# Patient Record
Sex: Male | Born: 1955 | Race: White | Hispanic: No | Marital: Married | Smoking: Current every day smoker
Health system: Southern US, Community
[De-identification: ages and names within clinical notes are randomized; demographics above are authoritative.]

## PROBLEM LIST (undated history)

## (undated) DIAGNOSIS — I10 Essential (primary) hypertension: Secondary | ICD-10-CM

## (undated) DIAGNOSIS — E119 Type 2 diabetes mellitus without complications: Secondary | ICD-10-CM

## (undated) DIAGNOSIS — M1990 Unspecified osteoarthritis, unspecified site: Secondary | ICD-10-CM

## (undated) HISTORY — DX: Type 2 diabetes mellitus without complications: E11.9

## (undated) HISTORY — PX: EYE SURGERY: SHX253

## (undated) HISTORY — DX: Essential (primary) hypertension: I10

## (undated) HISTORY — PX: NASAL SINUS SURGERY: SHX719

## (undated) HISTORY — PX: CARDIAC CATHETERIZATION: SHX172

## (undated) HISTORY — PX: OTHER SURGICAL HISTORY: SHX169

## (undated) HISTORY — PX: RECTAL SURGERY: SHX760

## (undated) HISTORY — DX: Unspecified osteoarthritis, unspecified site: M19.90

## (undated) HISTORY — PX: BACK SURGERY: SHX140

## (undated) HISTORY — PX: CERVICAL DISC SURGERY: SHX588

---

## 2019-10-13 ENCOUNTER — Other Ambulatory Visit: Payer: Self-pay | Admitting: Podiatry

## 2019-10-15 NOTE — Patient Instructions (Signed)
Corey Jensen  10/15/2019     _0 @   Your procedure is scheduled on  7/28/20221.  [facility] to [facility]  Call this number if you have problems the morning of surgery:  [phone]   Remember:  Do not eat or drink after midnight.                         Take these medicines the morning of surgery with A SIP OF WATER  Cymbalta, gabapentin, metoprolol, nucynta.    Do not wear jewelry, make-up or nail polish.  Do not wear lotions, powders, or perfumes, or deodorant.  Do not shave 48 hours prior to surgery.  Men may shave face and neck.  Do not bring valuables to the hospital.  [facility] is not responsible for any belongings or valuables.  Contacts, dentures or bridgework may not be worn into surgery.  Leave your suitcase in the car.  After surgery it may be brought to your room.  For patients admitted to the hospital, discharge time will be determined by your treatment team.  Patients discharged the day of surgery will not be allowed to drive home.   Name and phone number of your driver:   family Special instructions:  DO NOT smoke the morning of your procedure.  Please read over the following fact sheets that you were given. Anesthesia Post-op Instructions and Care and Recovery After Surgery       Living With an Amputation An amputation is a surgical procedure to remove all or part of an arm or leg (limb). After this surgery, it will take time for you to heal and get used to living with the amputation. There are things you can do to help yourself adjust. Living with an amputation can be challenging, but it is often possible to do all of the activities that you used to do. How to manage lifestyle changes      Return to your normal activities as told by your health care provider. Ask your health care provider what activities are safe for you.  You may choose to have an artificial limb (prosthesis) made for you. Use and care  for your prosthesis as told by your health care provider.  Discuss all of your leisure interests with your health care provider and prosthesis specialist. Equipment changes can often be made, which may allow you to return to a sport or hobby. Some Teacher, English as a foreign language for this purpose.  Talk with your health care team about returning to work. ? When you are ready to return to work, your therapists can evaluate your job site and make recommendations to help you do your job. ? If you are not able to return to the same job, [employer] can help you with job retraining.  Talk with your health care provider about returning to driving. You may: ? Work with an occupational therapist to learn new ways for safely driving with an amputation. ? Work with a prosthesis specialist or physical therapist to identify assistive devices for safe driving. How to recognize stress Some common challenges of living with an amputation may bring about stress. These issues are normal. They include:  Changes in how you move around.  Changes in how you care for yourself.  Changes in how you participate in leisure activities.  Emotions after the amputation, such as grief and anger.  Issues with body image and weight.  Problems with pain and treatment. How to manage stress Use these strategies to ease stress related to the daily challenges of living with an amputation:  Be aware of any sources of your stress. Monitor yourself for symptoms of stress. Identify what challenges cause the most stress for you.  Rethink the situation. Try to: ? Think realistically about stressful challenges. Do not ignore these challenges. Do not overreact to them. ? Try to find the positives in a stressful situation. Do not focus on the negatives.  Talk about your emotional challenges, such as grief, with a mental health professional.  Connect with other people who have gone through  the same experience.  Find ways to help relax your body and mind. Examples include: ? Meditation, deep breathing, or progressive relaxation techniques. ? Hypnosis or guided imagery. ? Yoga or tai chi. ? Biofeedback or mindfulness techniques. ? Keeping a journal. ? Doing hobbies, like listening to music or being out in nature. ? Exercise. Talk with your rehabilitation team about the best way to get 30 minutes of physical activity at least 5 days a week. Where to find support: Talking to others  Look for a local or online support group for people living with an amputation. Finances  Talk to your insurance company about what assistive devices your plan covers.  Contact national or local amputee charities to find out if you are eligible for scholarships and medical equipment for people in need.  People in [employer] service may be eligible for financial assistance or programs that support amputees. Rehabilitation A rehabilitation program can help you regain mobility and independence. Your rehabilitation team may include:  Physicians and nurses.  Physical and occupational therapists.  Prosthesis specialists.  Social workers.  Mental health professionals.  Diet and nutrition specialists. Follow these instructions at home: Managing pain  Work with your health care provider to manage any residual or phantom limb pain. This may include: ? Pain medicine. ? Physical therapy. ? Techniques that help retrain the brain and nervous system (movement representation techniques). ? Control and instrumentation engineer. For this treatment, stimulation is applied to different parts of your stump, and you describe what you feel. ? Biofeedback. This involves using monitors that alert you to changes in your breathing, heart rate, skin temperature, or muscle activity, and using relaxation techniques to reverse those changes. ? Acupuncture. Eating and drinking  Work with a dietitian to develop an eating  plan that helps you maintain a healthy body weight. Your plan may include a balance of: ? Fresh fruits and vegetables. ? Whole grains. ? Lean meats. ? Low-fat dairy. ? Healthy fats, such as fish, nuts, avocado, or olive oil.  Do not drink alcohol to cope with stress. General instructions  Take over-the-counter and prescription medicines only as told by your health care provider.  Do not use any products that contain nicotine or tobacco, such as cigarettes and e-cigarettes. If you need help quitting, ask your health care provider.  Keep all follow-up visits as told by your health care provider. This is important. Where to find more information You can find more information about living with an amputation from:  Amputee Coalition: www.amputee-coalition.org  Amputee Support Groups: amputee.supportgroups.[employer]: www.nationalamputation.[facility] on Health, Physical Activity and Disability: www.nchpad.org  [employer] [place]: www.disabledsports[place].org Contact a health care provider if:  Your pain does not improve with medicine or treatment.  You have trouble managing your emotions about losing an extremity.  Get help right away if you:  Have severe pain. Summary  It will take time for you to heal and get used to living with an amputation.  Look for a local or online support group for people living with an amputation.  Work with your health care provider to manage any phantom limb pain. This information is not intended to replace advice given to you by your health care provider. Make sure you discuss any questions you have with your health care provider. Document Revised: 07/03/2018 Document Reviewed: 02/05/2017 Elsevier Patient Education  2020 [place] After These instructions provide you with information about caring for yourself after your procedure. Your health care provider may also give you more  specific instructions. Your treatment has been planned according to current medical practices, but problems sometimes occur. Call your health care provider if you have any problems or questions after your procedure. What can I expect after the procedure? After your procedure, you may:  Feel sleepy for several hours.  Feel clumsy and have poor balance for several hours.  Feel forgetful about what happened after the procedure.  Have poor judgment for several hours.  Feel nauseous or vomit.  Have a sore throat if you had a breathing tube during the procedure. Follow these instructions at home: For at least 24 hours after the procedure:      Have a responsible adult stay with you. It is important to have someone help care for you until you are awake and alert.  Rest as needed.  Do not: ? Participate in activities in which you could fall or become injured. ? Drive. ? Use heavy machinery. ? Drink alcohol. ? Take sleeping pills or medicines that cause drowsiness. ? Make important decisions or sign legal documents. ? Take care of children on your own. Eating and drinking  Follow the diet that is recommended by your health care provider.  If you vomit, drink water, juice, or soup when you can drink without vomiting.  Make sure you have little or no nausea before eating solid foods. General instructions  Take over-the-counter and prescription medicines only as told by your health care provider.  If you have sleep apnea, surgery and certain medicines can increase your risk for breathing problems. Follow instructions from your health care provider about wearing your sleep device: ? Anytime you are sleeping, including during daytime naps. ? While taking prescription pain medicines, sleeping medicines, or medicines that make you drowsy.  If you smoke, do not smoke without supervision.  Keep all follow-up visits as told by your health care provider. This is important. Contact a  health care provider if:  You keep feeling nauseous or you keep vomiting.  You feel light-headed.  You develop a rash.  You have a fever. Get help right away if:  You have trouble breathing. Summary  For several hours after your procedure, you may feel sleepy and have poor judgment.  Have a responsible adult stay with you for at least 24 hours or until you are awake and alert. This information is not intended to replace advice given to you by your health care provider. Make sure you discuss any questions you have with your health care provider. Document Revised: 06/09/2017 Document Reviewed: 07/02/2015 Elsevier Patient Education  [place]. How to Use Chlorhexidine for Bathing Chlorhexidine gluconate (CHG) is a germ-killing (antiseptic) solution that is used to clean the skin. It can get rid of the bacteria that normally live on  the skin and can keep them away for about 24 hours. To clean your skin with CHG, you may be given:  A CHG solution to use in the shower or as part of a sponge bath.  A prepackaged cloth that contains CHG. Cleaning your skin with CHG may help lower the risk for infection:  While you are staying in the intensive care unit of the hospital.  If you have a vascular access, such as a central line, to provide short-term or long-term access to your veins.  If you have a catheter to drain urine from your bladder.  If you are on a ventilator. A ventilator is a machine that helps you breathe by moving air in and out of your lungs.  After surgery. What are the risks? Risks of using CHG include:  A skin reaction.  Hearing loss, if CHG gets in your ears.  Eye injury, if CHG gets in your eyes and is not rinsed out.  The CHG product catching fire. Make sure that you avoid smoking and flames after applying CHG to your skin. Do not use CHG:  If you have a chlorhexidine allergy or have previously reacted to chlorhexidine.  On babies younger than 49  months of age. How to use CHG solution  Use CHG only as told by your health care provider, and follow the instructions on the label.  Use the full amount of CHG as directed. Usually, this is one bottle. During a shower Follow these steps when using CHG solution during a shower (unless your health care provider gives you different instructions): 1. Start the shower. 2. Use your normal soap and shampoo to wash your face and hair. 3. Turn off the shower or move out of the shower stream. 4. Pour the CHG onto a clean washcloth. Do not use any type of brush or rough-edged sponge. 5. Starting at your neck, lather your body down to your toes. Make sure you follow these instructions: ? If you will be having surgery, pay special attention to the part of your body where you will be having surgery. Scrub this area for at least 1 minute. ? Do not use CHG on your head or face. If the solution gets into your ears or eyes, rinse them well with water. ? Avoid your genital area. ? Avoid any areas of skin that have broken skin, cuts, or scrapes. ? Scrub your back and under your arms. Make sure to wash skin folds. 6. Let the lather sit on your skin for 1-2 minutes or as long as told by your health care provider. 7. Thoroughly rinse your entire body in the shower. Make sure that all body creases and crevices are rinsed well. 8. Dry off with a clean towel. Do not put any substances on your body afterward--such as powder, lotion, or perfume--unless you are told to do so by your health care provider. Only use lotions that are recommended by the manufacturer. 9. Put on clean clothes or pajamas. 10. If it is the night before your surgery, sleep in clean sheets.  During a sponge bath Follow these steps when using CHG solution during a sponge bath (unless your health care provider gives you different instructions): 1. Use your normal soap and shampoo to wash your face and hair. 2. Pour the CHG onto a clean  washcloth. 3. Starting at your neck, lather your body down to your toes. Make sure you follow these instructions: ? If you will be having surgery, pay special attention  to the part of your body where you will be having surgery. Scrub this area for at least 1 minute. ? Do not use CHG on your head or face. If the solution gets into your ears or eyes, rinse them well with water. ? Avoid your genital area. ? Avoid any areas of skin that have broken skin, cuts, or scrapes. ? Scrub your back and under your arms. Make sure to wash skin folds. 4. Let the lather sit on your skin for 1-2 minutes or as long as told by your health care provider. 5. Using a different clean, wet washcloth, thoroughly rinse your entire body. Make sure that all body creases and crevices are rinsed well. 6. Dry off with a clean towel. Do not put any substances on your body afterward--such as powder, lotion, or perfume--unless you are told to do so by your health care provider. Only use lotions that are recommended by the manufacturer. 7. Put on clean clothes or pajamas. 8. If it is the night before your surgery, sleep in clean sheets. How to use CHG prepackaged cloths  Only use CHG cloths as told by your health care provider, and follow the instructions on the label.  Use the CHG cloth on clean, dry skin.  Do not use the CHG cloth on your head or face unless your health care provider tells you to.  When washing with the CHG cloth: ? Avoid your genital area. ? Avoid any areas of skin that have broken skin, cuts, or scrapes. Before surgery Follow these steps when using a CHG cloth to clean before surgery (unless your health care provider gives you different instructions): 1. Using the CHG cloth, vigorously scrub the part of your body where you will be having surgery. Scrub using a back-and-forth motion for 3 minutes. The area on your body should be completely wet with CHG when you are done scrubbing. 2. Do not rinse. Discard  the cloth and let the area air-dry. Do not put any substances on the area afterward, such as powder, lotion, or perfume. 3. Put on clean clothes or pajamas. 4. If it is the night before your surgery, sleep in clean sheets.  For general bathing Follow these steps when using CHG cloths for general bathing (unless your health care provider gives you different instructions). 1. Use a separate CHG cloth for each area of your body. Make sure you wash between any folds of skin and between your fingers and toes. Wash your body in the following order, switching to a new cloth after each step: ? The front of your neck, shoulders, and chest. ? Both of your arms, under your arms, and your hands. ? Your stomach and groin area, avoiding the genitals. ? Your right leg and foot. ? Your left leg and foot. ? The back of your neck, your back, and your buttocks. 2. Do not rinse. Discard the cloth and let the area air-dry. Do not put any substances on your body afterward--such as powder, lotion, or perfume--unless you are told to do so by your health care provider. Only use lotions that are recommended by the manufacturer. 3. Put on clean clothes or pajamas. Contact a health care provider if:  Your skin gets irritated after scrubbing.  You have questions about using your solution or cloth. Get help right away if:  Your eyes become very red or swollen.  Your eyes itch badly.  Your skin itches badly and is red or swollen.  Your hearing changes.  You  have trouble seeing.  You have swelling or tingling in your mouth or throat.  You have trouble breathing.  You swallow any chlorhexidine. Summary  Chlorhexidine gluconate (CHG) is a germ-killing (antiseptic) solution that is used to clean the skin. Cleaning your skin with CHG may help to lower your risk for infection.  You may be given CHG to use for bathing. It may be in a bottle or in a prepackaged cloth to use on your skin. Carefully follow your  health care provider's instructions and the instructions on the product label.  Do not use CHG if you have a chlorhexidine allergy.  Contact your health care provider if your skin gets irritated after scrubbing. This information is not intended to replace advice given to you by your health care provider. Make sure you discuss any questions you have with your health care provider. Document Revised: 05/28/2018 Document Reviewed: 02/06/2017 Elsevier Patient Education  [place].

## 2019-10-19 ENCOUNTER — Encounter (HOSPITAL_COMMUNITY)
Admission: RE | Admit: 2019-10-19 | Discharge: 2019-10-19 | Disposition: A | Discharge status: Home / Self Care | Payer: Medicare Other | Source: Ambulatory Visit | Attending: Podiatry | Admitting: Podiatry

## 2019-10-19 ENCOUNTER — Encounter (HOSPITAL_COMMUNITY): Payer: Self-pay

## 2019-10-19 ENCOUNTER — Other Ambulatory Visit (HOSPITAL_COMMUNITY)
Admission: RE | Admit: 2019-10-19 | Discharge: 2019-10-19 | Disposition: A | Discharge status: Home / Self Care | Payer: Medicare Other | Source: Ambulatory Visit | Attending: Podiatry | Admitting: Podiatry

## 2019-10-19 ENCOUNTER — Other Ambulatory Visit (HOSPITAL_COMMUNITY): Payer: Self-pay

## 2019-10-19 ENCOUNTER — Other Ambulatory Visit: Payer: Self-pay

## 2019-10-19 DIAGNOSIS — Z01812 Encounter for preprocedural laboratory examination: Secondary | ICD-10-CM | POA: Diagnosis present

## 2019-10-19 DIAGNOSIS — Z20822 Contact with and (suspected) exposure to covid-19: Secondary | ICD-10-CM | POA: Insufficient documentation

## 2019-10-19 DIAGNOSIS — Z01810 Encounter for preprocedural cardiovascular examination: Secondary | ICD-10-CM | POA: Diagnosis not present

## 2019-10-19 LAB — BASIC METABOLIC PANEL
Anion gap: 12 (ref 5–15)
BUN: 30 mg/dL — ABNORMAL HIGH (ref 8–23)
CO2: 22 mmol/L (ref 22–32)
Calcium: 9.8 mg/dL (ref 8.9–10.3)
Chloride: 94 mmol/L — ABNORMAL LOW (ref 98–111)
Creatinine, Ser: 1.67 mg/dL — ABNORMAL HIGH (ref 0.61–1.24)
GFR calc Af Amer: 49 mL/min — ABNORMAL LOW (ref >60)
GFR calc non Af Amer: 42 mL/min — ABNORMAL LOW (ref >60)
Glucose, Bld: 124 mg/dL — ABNORMAL HIGH (ref 70–99)
Potassium: 4.5 mmol/L (ref 3.5–5.1)
Sodium: 128 mmol/L — ABNORMAL LOW (ref 135–145)

## 2019-10-19 LAB — GLUCOSE, CAPILLARY: Glucose-Capillary: 135 mg/dL — ABNORMAL HIGH (ref 70–99)

## 2019-10-19 LAB — HEMOGLOBIN A1C
Hgb A1c MFr Bld: 6.1 % — ABNORMAL HIGH (ref 4.8–5.6)
Mean Plasma Glucose: 128.37 mg/dL

## 2019-10-19 LAB — SARS CORONAVIRUS 2 (TAT 6-24 HRS): SARS Coronavirus 2: NEGATIVE

## 2019-10-19 NOTE — Progress Notes (Signed)
   10/19/19 0950  OBSTRUCTIVE SLEEP APNEA  Have you ever been diagnosed with sleep apnea through a sleep study? No  Do you snore loudly (loud enough to be heard through closed doors)?  1  Do you often feel tired, fatigued, or sleepy during the daytime (such as falling asleep during driving or talking to someone)? 1  Has anyone observed you stop breathing during your sleep? 0  Do you have, or are you being treated for high blood pressure? 1  BMI more than 35 kg/m2? 0  Age > 50 (1-yes) 1  Male Gender (Yes=1) 1  Obstructive Sleep Apnea Score 5

## 2019-10-19 NOTE — Progress Notes (Signed)
   10/19/19 0950  OBSTRUCTIVE SLEEP APNEA  Have you ever been diagnosed with sleep apnea through a sleep study? No  Do you snore loudly (loud enough to be heard through closed doors)?  1  Do you often feel tired, fatigued, or sleepy during the daytime (such as falling asleep during driving or talking to someone)? 1  Has anyone observed you stop breathing during your sleep? 0  Do you have, or are you being treated for high blood pressure? 1  BMI more than 35 kg/m2? 0  Age > 50 (1-yes) 1  Neck circumference greater than:Male 16 inches or larger, Male 17inches or larger? 0  Male Gender (Yes=1) 1  Obstructive Sleep Apnea Score 5

## 2019-10-20 ENCOUNTER — Encounter (HOSPITAL_COMMUNITY)
Admission: RE | Disposition: A | Discharge status: Home / Self Care | Payer: Self-pay | Source: Home / Self Care | Attending: Podiatry

## 2019-10-20 ENCOUNTER — Ambulatory Visit (HOSPITAL_COMMUNITY): Payer: Medicare Other | Admitting: Anesthesiology

## 2019-10-20 ENCOUNTER — Encounter (HOSPITAL_COMMUNITY): Payer: Self-pay | Admitting: *Deleted

## 2019-10-20 ENCOUNTER — Ambulatory Visit (HOSPITAL_COMMUNITY)
Admission: RE | Admit: 2019-10-20 | Discharge: 2019-10-20 | Disposition: A | Discharge status: Home / Self Care | Payer: Medicare Other | Attending: Podiatry | Admitting: Podiatry

## 2019-10-20 ENCOUNTER — Ambulatory Visit (HOSPITAL_COMMUNITY): Payer: Medicare Other

## 2019-10-20 DIAGNOSIS — M86172 Other acute osteomyelitis, left ankle and foot: Secondary | ICD-10-CM | POA: Diagnosis present

## 2019-10-20 DIAGNOSIS — M1990 Unspecified osteoarthritis, unspecified site: Secondary | ICD-10-CM | POA: Diagnosis not present

## 2019-10-20 DIAGNOSIS — Z7984 Long term (current) use of oral hypoglycemic drugs: Secondary | ICD-10-CM | POA: Insufficient documentation

## 2019-10-20 DIAGNOSIS — E119 Type 2 diabetes mellitus without complications: Secondary | ICD-10-CM | POA: Diagnosis not present

## 2019-10-20 DIAGNOSIS — Z79899 Other long term (current) drug therapy: Secondary | ICD-10-CM | POA: Diagnosis not present

## 2019-10-20 DIAGNOSIS — F17200 Nicotine dependence, unspecified, uncomplicated: Secondary | ICD-10-CM | POA: Diagnosis not present

## 2019-10-20 DIAGNOSIS — I10 Essential (primary) hypertension: Secondary | ICD-10-CM | POA: Insufficient documentation

## 2019-10-20 DIAGNOSIS — Z9889 Other specified postprocedural states: Secondary | ICD-10-CM

## 2019-10-20 HISTORY — PX: AMPUTATION: SHX166

## 2019-10-20 LAB — GLUCOSE, CAPILLARY: Glucose-Capillary: 147 mg/dL — ABNORMAL HIGH (ref 70–99)

## 2019-10-20 LAB — POCT I-STAT, CHEM 8
BUN: 27 mg/dL — ABNORMAL HIGH (ref 8–23)
Calcium, Ion: 1.1 mmol/L — ABNORMAL LOW (ref 1.15–1.40)
Chloride: 102 mmol/L (ref 98–111)
Creatinine, Ser: 1.4 mg/dL — ABNORMAL HIGH (ref 0.61–1.24)
Glucose, Bld: 181 mg/dL — ABNORMAL HIGH (ref 70–99)
HCT: 41 % (ref 39.0–52.0)
Hemoglobin: 13.9 g/dL (ref 13.0–17.0)
Potassium: 5.2 mmol/L — ABNORMAL HIGH (ref 3.5–5.1)
Sodium: 131 mmol/L — ABNORMAL LOW (ref 135–145)
TCO2: 20 mmol/L — ABNORMAL LOW (ref 22–32)

## 2019-10-20 SURGERY — AMPUTATION DIGIT
Anesthesia: General | Laterality: Left

## 2019-10-20 MED ORDER — ORAL CARE MOUTH RINSE: 15.0000 mL | Freq: Once | OROMUCOSAL | Status: AC

## 2019-10-20 MED ORDER — ONDANSETRON HCL 4 MG/2ML IJ SOLN
INTRAMUSCULAR | Status: DC | PRN
  Administered 2019-10-20: 4 mg via INTRAVENOUS

## 2019-10-20 MED ORDER — HYDROMORPHONE HCL 1 MG/ML IJ SOLN: 0.2500 mg | INTRAMUSCULAR | Status: DC | PRN

## 2019-10-20 MED ORDER — LIDOCAINE HCL (PF) 1 % IJ SOLN
INTRAMUSCULAR | Status: AC
  Filled 2019-10-20: qty 30

## 2019-10-20 MED ORDER — CEFAZOLIN SODIUM-DEXTROSE 2-4 GM/100ML-% IV SOLN
2.0000 g | Freq: Once | INTRAVENOUS | Status: AC
  Administered 2019-10-20: 2 g via INTRAVENOUS

## 2019-10-20 MED ORDER — PHENYLEPHRINE 40 MCG/ML (10ML) SYRINGE FOR IV PUSH (FOR BLOOD PRESSURE SUPPORT)
PREFILLED_SYRINGE | INTRAVENOUS | Status: DC | PRN
  Administered 2019-10-20: 80 ug via INTRAVENOUS
  Administered 2019-10-20: 160 ug via INTRAVENOUS
  Administered 2019-10-20 (×2): 80 ug via INTRAVENOUS

## 2019-10-20 MED ORDER — CEFAZOLIN SODIUM-DEXTROSE 2-4 GM/100ML-% IV SOLN
INTRAVENOUS | Status: AC
  Filled 2019-10-20: qty 100

## 2019-10-20 MED ORDER — BUPIVACAINE HCL (PF) 0.5 % IJ SOLN
INTRAMUSCULAR | Status: AC
  Filled 2019-10-20: qty 30

## 2019-10-20 MED ORDER — EPHEDRINE SULFATE 50 MG/ML IJ SOLN
INTRAMUSCULAR | Status: DC | PRN
Start: 2019-10-20 — End: 2019-10-20
  Administered 2019-10-20: 10 mg via INTRAVENOUS

## 2019-10-20 MED ORDER — PHENYLEPHRINE 40 MCG/ML (10ML) SYRINGE FOR IV PUSH (FOR BLOOD PRESSURE SUPPORT)
PREFILLED_SYRINGE | INTRAVENOUS | Status: AC
  Filled 2019-10-20: qty 10

## 2019-10-20 MED ORDER — MIDAZOLAM HCL 5 MG/5ML IJ SOLN
INTRAMUSCULAR | Status: DC | PRN
  Administered 2019-10-20: 2 mg via INTRAVENOUS

## 2019-10-20 MED ORDER — PROPOFOL 500 MG/50ML IV EMUL
INTRAVENOUS | Status: DC | PRN
  Administered 2019-10-20: 100 ug/kg/min via INTRAVENOUS

## 2019-10-20 MED ORDER — MIDAZOLAM HCL 2 MG/2ML IJ SOLN
INTRAMUSCULAR | Status: AC
  Filled 2019-10-20: qty 2

## 2019-10-20 MED ORDER — LIDOCAINE 2% (20 MG/ML) 5 ML SYRINGE: INTRAMUSCULAR | Status: DC | PRN

## 2019-10-20 MED ORDER — LIDOCAINE HCL 1 % IJ SOLN
INTRAMUSCULAR | Status: DC | PRN
  Administered 2019-10-20: 10 mL via INTRAMUSCULAR

## 2019-10-20 MED ORDER — FENTANYL CITRATE (PF) 100 MCG/2ML IJ SOLN
INTRAMUSCULAR | Status: AC
  Filled 2019-10-20: qty 2

## 2019-10-20 MED ORDER — FENTANYL CITRATE (PF) 250 MCG/5ML IJ SOLN
INTRAMUSCULAR | Status: DC | PRN
  Administered 2019-10-20: 50 ug via INTRAVENOUS
  Administered 2019-10-20 (×2): 25 ug via INTRAVENOUS

## 2019-10-20 MED ORDER — CHLORHEXIDINE GLUCONATE CLOTH 2 % EX PADS: 6.0000 | MEDICATED_PAD | Freq: Once | CUTANEOUS | Status: DC

## 2019-10-20 MED ORDER — PROPOFOL 10 MG/ML IV BOLUS
INTRAVENOUS | Status: AC
  Filled 2019-10-20: qty 20

## 2019-10-20 MED ORDER — CEFAZOLIN SODIUM-DEXTROSE 2-4 GM/100ML-% IV SOLN: 2.0000 g | INTRAVENOUS | Status: DC

## 2019-10-20 MED ORDER — 0.9 % SODIUM CHLORIDE (POUR BTL) OPTIME
TOPICAL | Status: DC | PRN
  Administered 2019-10-20: 1000 mL

## 2019-10-20 MED ORDER — LACTATED RINGERS IV SOLN
INTRAVENOUS | Status: DC | PRN
Start: 2019-10-20 — End: 2019-10-20

## 2019-10-20 MED ORDER — PROMETHAZINE HCL 25 MG/ML IJ SOLN: 6.2500 mg | INTRAMUSCULAR | Status: DC | PRN

## 2019-10-20 MED ORDER — MEPERIDINE HCL 50 MG/ML IJ SOLN: 6.2500 mg | INTRAMUSCULAR | Status: DC | PRN

## 2019-10-20 MED ORDER — DEXTROSE 5 % IV SOLN: 2000.0000 mg | Freq: Once | INTRAVENOUS | Status: DC

## 2019-10-20 MED ORDER — LACTATED RINGERS IV SOLN: Freq: Once | INTRAVENOUS | Status: AC

## 2019-10-20 MED ORDER — CHLORHEXIDINE GLUCONATE 0.12 % MT SOLN
15.0000 mL | Freq: Once | OROMUCOSAL | Status: AC
  Administered 2019-10-20: 15 mL via OROMUCOSAL
  Filled 2019-10-20: qty 15

## 2019-10-20 SURGICAL SUPPLY — 39 items
APL SKNCLS STERI-STRIP NONHPOA (GAUZE/BANDAGES/DRESSINGS)
BANDAGE ESMARK 4X12 BL STRL LF (DISPOSABLE) ×1 IMPLANT
BENZOIN TINCTURE PRP APPL 2/3 (GAUZE/BANDAGES/DRESSINGS) IMPLANT
BLADE AVERAGE 25MMX9MM (BLADE) ×1
BLADE AVERAGE 25X9 (BLADE) ×2 IMPLANT
BLADE SURG 15 STRL LF DISP TIS (BLADE) ×1 IMPLANT
BLADE SURG 15 STRL SS (BLADE) ×3
BNDG CMPR 12X4 ELC STRL LF (DISPOSABLE) ×1
BNDG CMPR STD VLCR NS LF 5.8X4 (GAUZE/BANDAGES/DRESSINGS) ×1
BNDG CONFORM 2 STRL LF (GAUZE/BANDAGES/DRESSINGS) ×3 IMPLANT
BNDG ELASTIC 4X5.8 VLCR NS LF (GAUZE/BANDAGES/DRESSINGS) ×3 IMPLANT
BNDG ESMARK 4X12 BLUE STRL LF (DISPOSABLE) ×3
BNDG GAUZE ELAST 4 BULKY (GAUZE/BANDAGES/DRESSINGS) IMPLANT
CLOTH BEACON ORANGE TIMEOUT ST (SAFETY) ×3 IMPLANT
COVER LIGHT HANDLE STERIS (MISCELLANEOUS) ×3 IMPLANT
COVER WAND RF STERILE (DRAPES) ×3 IMPLANT
CUFF TOURN SGL QUICK 18X4 (TOURNIQUET CUFF) ×3 IMPLANT
DECANTER SPIKE VIAL GLASS SM (MISCELLANEOUS) ×3 IMPLANT
DRSG ADAPTIC 3X8 NADH LF (GAUZE/BANDAGES/DRESSINGS) ×3 IMPLANT
ELECT REM PT RETURN 9FT ADLT (ELECTROSURGICAL) ×3
ELECTRODE REM PT RTRN 9FT ADLT (ELECTROSURGICAL) ×1 IMPLANT
GAUZE SPONGE 4X4 12PLY STRL (GAUZE/BANDAGES/DRESSINGS) ×3 IMPLANT
GLOVE BIOGEL PI IND STRL 7.0 (GLOVE) ×1 IMPLANT
GLOVE BIOGEL PI IND STRL 7.5 (GLOVE) ×1 IMPLANT
GLOVE BIOGEL PI INDICATOR 7.0 (GLOVE) ×2
GLOVE BIOGEL PI INDICATOR 7.5 (GLOVE) ×2
GLOVE ECLIPSE 7.0 STRL STRAW (GLOVE) ×3 IMPLANT
GOWN STRL REUS W/TWL LRG LVL3 (GOWN DISPOSABLE) ×6 IMPLANT
KIT TURNOVER KIT A (KITS) ×3 IMPLANT
MANIFOLD NEPTUNE II (INSTRUMENTS) ×3 IMPLANT
NEEDLE HYPO 25X1 1.5 SAFETY (NEEDLE) ×6 IMPLANT
NEEDLE HYPO 27GX1-1/4 (NEEDLE) ×6 IMPLANT
NS IRRIG 1000ML POUR BTL (IV SOLUTION) ×3 IMPLANT
PACK BASIC LIMB (CUSTOM PROCEDURE TRAY) ×3 IMPLANT
PAD ARMBOARD 7.5X6 YLW CONV (MISCELLANEOUS) ×3 IMPLANT
SET BASIN LINEN APH (SET/KITS/TRAYS/PACK) ×3 IMPLANT
SUT ETHILON 3 0 FSL (SUTURE) ×3 IMPLANT
SWAB CULTURE ESWAB REG 1ML (MISCELLANEOUS) ×3 IMPLANT
SWAB CULTURE LIQ STUART DBL (MISCELLANEOUS) ×3 IMPLANT

## 2019-10-20 NOTE — Anesthesia Preprocedure Evaluation (Addendum)
Anesthesia Evaluation  Patient identified by MRN, date of birth, ID band Patient awake    Reviewed: Allergy & Precautions, NPO status , Patient's Chart, lab work & pertinent test results  Airway Mallampati: II  TM Distance: >3 FB Neck ROM: Full   Comment: cervical disc sx Dental  (+) Teeth Intact, Dental Advisory Given   Pulmonary Current Smoker and Patient abstained from smoking.,    Pulmonary exam normal breath sounds clear to auscultation       Cardiovascular Exercise Tolerance: Good hypertension, Pt. on medications Normal cardiovascular exam Rhythm:Regular Rate:Normal     Neuro/Psych negative neurological ROS  negative psych ROS   GI/Hepatic negative GI ROS, (+)     substance abuse  alcohol use,   Endo/Other  diabetes, Well Controlled, Type 2, Oral Hypoglycemic Agents  Renal/GU      Musculoskeletal  (+) Arthritis  (cervical disc sx, back sx),   Abdominal   Peds  Hematology   Anesthesia Other Findings   Reproductive/Obstetrics                            Anesthesia Physical Anesthesia Plan  ASA: II  Anesthesia Plan: General   Post-op Pain Management:    Induction:   PONV Risk Score and Plan: Ondansetron  Airway Management Planned: Nasal Cannula, Natural Airway and Simple Face Mask  Additional Equipment:   Intra-op Plan:   Post-operative Plan:   Informed Consent: I have reviewed the patients History and Physical, chart, labs and discussed the procedure including the risks, benefits and alternatives for the proposed anesthesia with the patient or authorized representative who has indicated his/her understanding and acceptance.     Dental advisory given  Plan Discussed with: CRNA and Surgeon  Anesthesia Plan Comments: (possible GA with airway was discussed.)       Anesthesia Quick Evaluation

## 2019-10-20 NOTE — Op Note (Signed)
10/20/2019  11:36 AM  PATIENT:  Corey Jensen  64 y.o. 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   DISPOSITION OF SPECIMEN:  PATHOLOGY  COUNTS:  YES  TOURNIQUET:   Total Tourniquet Time Documented: Calf (Left) - 14 minutes Total: Calf (Left) - 14 minutes\   Patient was brought into the operating room laid supine on the operating table. Ankle tourniquet was applied to the surgical extremity. Following IV sedation, a local block was achieved using 10 cc of mixture of 1% plain lidocaine with 0.5% marcaine. The foot was the prepped, scrubbed and draped in aseptic manner. Using an esmarch band the tourniquet on the surgical site was inflatted at 21mHG.   Attention was directed towards left second toe. MRI and radiograph confirmed osteomyelitis of the distal phalanx of the left second toe with chronic ulceration at the tip of the left second toe. Fish mouth inicion was planned just distal to the PIPJ. Using a #15 blade skin incision was made. The left second toe was disarticulated at the PIPJ and removed. Two specimen was sent. Middle phalanx as clean margin was sent. Wound was flushed and wound cultures were obtained. Skin was closed using 4-0 Nylon. Dry sterile dressing was applied. Tourniquet was deflated.Capillary refill time was brisk to lesser digits.   Patient will be partial weightbearing in the surgical shoe.

## 2019-10-20 NOTE — H&P (Signed)
.   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  The various methods of treatment have been discussed with the patient.  No guarantees were given.  After consideration of risks, benefits and other options for treatment, the patient has consented to surgery.  I have reviewed the patients' chart and labs.    Patient Vitals for the past 24 hrs:  BP Temp Temp src Pulse Resp SpO2 Height Weight  10/20/19 1009 (!) 119/60 97.8 F (36.6 C) Oral 62 16 95 % 5\' 11"  (1.803 m) (!) 104.3 kg    A history and physical examination was performed in my office.  The patient was reexamined.  There have been no changes to this history and physical examination.  , DPM

## 2019-10-20 NOTE — Brief Op Note (Signed)
10/20/2019  11:36 AM  PATIENT:  Corey Jensen  64 y.o. 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   DISPOSITION OF SPECIMEN:  PATHOLOGY  COUNTS:  YES  TOURNIQUET:   Total Tourniquet Time Documented: Calf (Left) - 14 minutes Total: Calf (Left) - 14 minutes   DICTATION: .Viviann Spare Dictation  PLAN OF CARE: Discharge to home after PACU  PATIENT DISPOSITION:  PACU - hemodynamically stable.   Delay start of Pharmacological VTE agent (>24hrs) due to surgical blood loss or risk of bleeding: not applicable

## 2019-10-20 NOTE — Transfer of Care (Signed)
Immediate Anesthesia Transfer of Care Note  Patient: Corey Jensen  Procedure(s) Performed: PARTIAL TOE  AMPUTATION LEFT 2ND TOE (Left )  Patient Location: PACU  Anesthesia Type:MAC  Level of Consciousness: awake, alert , oriented and patient cooperative  Airway & Oxygen Therapy: Patient Spontanous Breathing and Patient connected to nasal cannula oxygen  Post-op Assessment: Report given to RN, Post -op Vital signs reviewed and stable and Patient moving all extremities  Post vital signs: Reviewed and stable  Last Vitals:  Vitals Value Taken Time  BP    Temp    Pulse 74 10/20/19 1140  Resp 15 10/20/19 1140  SpO2 100 % 10/20/19 1140  Vitals shown include unvalidated device data.  Last Pain:  Vitals:   10/20/19 1009  TempSrc: Oral  PainSc: 6       Patients Stated Pain Goal: 5 (27/06/23 7628)  Complications: No complications documented.

## 2019-10-20 NOTE — Discharge Instructions (Addendum)
These instructions will give you an idea of what to expect after surgery and how to manage issues that may arise before your first post op office visit.  Pain Management Pain is best managed by "staying ahead" of it. If pain gets out of control, it is difficult to get it back under control. Local anesthesia that lasts 6-8 hours is used to numb the foot and decrease pain.  For the best pain control, take the pain medication every 4 hours for the first 2 days post op. On the third day pain medication can be taken as needed.   Post Op Nausea Nausea is common after surgery, so it is managed proactively.  If prescribed, use the prescribed nausea medication regularly for the first 2 days post op.  Bandages Do not worry if there is blood on the bandage. What looks like a lot of blood on the bandage is actually a small amount. Blood on the dressing spreads out as it is absorbed by the gauze, the same way a drop of water spreads out on a paper towel.  If the bandages feel wet or dry, stiff and uncomfortable, call the office during office hours and we will schedule a time for you to have the bandage changed.  Unless you are specifically told otherwise, we will do the first bandage change in the office.  Keep your bandage dry. If the bandage becomes wet or soiled, notify the office and we will schedule a time to change the bandage.  Activity It is best to spend most of the first 2 days after surgery lying down with the foot elevated above the level of your heart. You may put weight on your heel while wearing the surgical shoe.   You may only get up to go to the restroom.  Driving Do not drive until you are able to respond in an emergency (i.e. slam on the brakes). This usually occurs after the bone has healed - 6 to 8 weeks.  Call the Office If you have a fever over 101F.  If you have increasing pain after the initial post op pain has settled down.  If you have increasing redness, swelling, or  drainage.  If you have any questions or concerns.     Monitored Anesthesia Care, Care After These instructions provide you with information about caring for yourself after your procedure. Your health care provider may also give you more specific instructions. Your treatment has been planned according to current medical practices, but problems sometimes occur. Call your health care provider if you have any problems or questions after your procedure. What can I expect after the procedure? After your procedure, you may: Feel sleepy for several hours. Feel clumsy and have poor balance for several hours. Feel forgetful about what happened after the procedure. Have poor judgment for several hours. Feel nauseous or vomit. Have a sore throat if you had a breathing tube during the procedure. Follow these instructions at home: For at least 24 hours after the procedure:     Have a responsible adult stay with you. It is important to have someone help care for you until you are awake and alert. Rest as needed. Do not: Participate in activities in which you could fall or become injured. Drive. Use heavy machinery. Drink alcohol. Take sleeping pills or medicines that cause drowsiness. Make important decisions or sign legal documents. Take care of children on your own. Eating and drinking Follow the diet that is recommended by your health care   provider. If you vomit, drink water, juice, or soup when you can drink without vomiting. Make sure you have little or no nausea before eating solid foods. General instructions Take over-the-counter and prescription medicines only as told by your health care provider. If you have sleep apnea, surgery and certain medicines can increase your risk for breathing problems. Follow instructions from your health care provider about wearing your sleep device: Anytime you are sleeping, including during daytime naps. While taking prescription pain medicines, sleeping  medicines, or medicines that make you drowsy. If you smoke, do not smoke without supervision. Keep all follow-up visits as told by your health care provider. This is important. Contact a health care provider if: You keep feeling nauseous or you keep vomiting. You feel light-headed. You develop a rash. You have a fever. Get help right away if: You have trouble breathing. Summary For several hours after your procedure, you may feel sleepy and have poor judgment. Have a responsible adult stay with you for at least 24 hours or until you are awake and alert. This information is not intended to replace advice given to you by your health care provider. Make sure you discuss any questions you have with your health care provider. Document Revised: 06/09/2017 Document Reviewed: 07/02/2015 Elsevier Patient Education  2020 [employer]  

## 2019-10-21 ENCOUNTER — Encounter (HOSPITAL_COMMUNITY): Payer: Self-pay | Admitting: Podiatry

## 2019-10-22 LAB — SURGICAL PATHOLOGY

## 2019-10-25 LAB — AEROBIC/ANAEROBIC CULTURE W GRAM STAIN (SURGICAL/DEEP WOUND): Culture: NO GROWTH

## 2019-10-25 NOTE — Anesthesia Postprocedure Evaluation (Signed)
Anesthesia Post Note  Patient: Corey Jensen  Procedure(s) Performed: PARTIAL TOE  AMPUTATION LEFT 2ND TOE (Left )  Patient location during evaluation: PACU Anesthesia Type: General Level of consciousness: awake and alert and oriented Pain management: pain level controlled Vital Signs Assessment: post-procedure vital signs reviewed and stable Respiratory status: spontaneous breathing Cardiovascular status: blood pressure returned to baseline Postop Assessment: no apparent nausea or vomiting Anesthetic complications: no   No complications documented.   Last Vitals:  Vitals:   10/20/19 1145 10/20/19 1202  BP:  116/81  Pulse: 70 63  Resp: 14 14  Temp:  36.7 C  SpO2: 95% 97%    Last Pain:  Vitals:   10/21/19 0943  TempSrc:   PainSc: 4                  [name]

## 2020-03-09 ENCOUNTER — Encounter (HOSPITAL_BASED_OUTPATIENT_CLINIC_OR_DEPARTMENT_OTHER)
Admission: RE | Admit: 2020-03-09 | Discharge: 2020-03-09 | Disposition: A | Discharge status: Home / Self Care | Payer: Medicare Other | Source: Ambulatory Visit | Attending: Orthopedic Surgery | Admitting: Orthopedic Surgery

## 2020-03-09 ENCOUNTER — Other Ambulatory Visit: Payer: Self-pay | Admitting: Orthopedic Surgery

## 2020-03-09 ENCOUNTER — Other Ambulatory Visit (HOSPITAL_COMMUNITY)
Admission: RE | Admit: 2020-03-09 | Discharge: 2020-03-09 | Disposition: A | Discharge status: Home / Self Care | Payer: Medicare Other | Source: Ambulatory Visit | Attending: Orthopedic Surgery | Admitting: Orthopedic Surgery

## 2020-03-09 ENCOUNTER — Other Ambulatory Visit: Payer: Self-pay

## 2020-03-09 ENCOUNTER — Encounter (HOSPITAL_BASED_OUTPATIENT_CLINIC_OR_DEPARTMENT_OTHER): Payer: Self-pay | Admitting: Orthopedic Surgery

## 2020-03-09 DIAGNOSIS — M65342 Trigger finger, left ring finger: Secondary | ICD-10-CM | POA: Diagnosis present

## 2020-03-09 DIAGNOSIS — Z01812 Encounter for preprocedural laboratory examination: Secondary | ICD-10-CM | POA: Insufficient documentation

## 2020-03-09 DIAGNOSIS — Z20822 Contact with and (suspected) exposure to covid-19: Secondary | ICD-10-CM | POA: Insufficient documentation

## 2020-03-09 DIAGNOSIS — F17200 Nicotine dependence, unspecified, uncomplicated: Secondary | ICD-10-CM | POA: Diagnosis not present

## 2020-03-09 LAB — BASIC METABOLIC PANEL
Anion gap: 12 (ref 5–15)
BUN: 15 mg/dL (ref 8–23)
CO2: 23 mmol/L (ref 22–32)
Calcium: 10.1 mg/dL (ref 8.9–10.3)
Chloride: 100 mmol/L (ref 98–111)
Creatinine, Ser: 1.92 mg/dL — ABNORMAL HIGH (ref 0.61–1.24)
GFR, Estimated: 38 mL/min — ABNORMAL LOW (ref >60)
Glucose, Bld: 130 mg/dL — ABNORMAL HIGH (ref 70–99)
Potassium: 5.1 mmol/L (ref 3.5–5.1)
Sodium: 135 mmol/L (ref 135–145)

## 2020-03-09 LAB — SARS CORONAVIRUS 2 (TAT 6-24 HRS): SARS Coronavirus 2: NEGATIVE

## 2020-03-09 NOTE — Progress Notes (Signed)

## 2020-03-09 NOTE — H&P (Signed)
Primary Care Provider: Renaldo Harrison, DO Pain Management Physician: Dr. Metta Clines Referring Provider: Dr. Irving Shows Comp: No Date of Injury or Onset: 2+ months PTP 02/25/2019 DOS: 04-16-19 Procedure: L ECTR  History: CC / Reason for Visit: Left hand problem including thumb and pisiform HPI:   This patient returns to clinic today indicating that he has to additional problems today along with the left ring finger trigger that has not yet fully resolved.  The patient indicates when he utilizes his hand and plays his guitar.  He feels pain at the base of his hand ulnarly as well as with pinching activities into his thumb.  He also states that the ring finger is not yet fully resolved despite injection.    HPI 02/09/20:This patient returns reevaluation, indicating that his ring finger is better, but not fully well, still catching but much less painful.  In addition there is a little bit of pain sticking into the small finger.  HPI 01-13-20: This patient returns reevaluation, indicating that his ring finger trigger is quite problematic.  By way of review, he underwent small finger trigger injections in March and April, and this has largely resolved.  The ring finger was injected in April with a planned one-month follow-up that did not occur.    HPI 07-08-19: This patient returns reevaluation, indicating that the small finger is better, but not fully well, still a little sore and unable to get into a tight flexed position.  In addition the ring finger has become sore with intermittent catching.  HPI 05-27-19: This patient is well known to [place] and return to clinic today secondary to issues with his left small finger.  He states that it seems to be stuck and he is unable to extend it or flex it from the current position that it is in.  He states that this occurred a couple weeks ago and that he was concerned that he had done something wrong.  Review of systems as related to current complaint reviewed and  unchanged.  Exam:  Vitals: Refer to EMR. Constitutional:  WD, WN, NAD HEENT:  NCAT, EOMI Neuro/Psych:  Alert & oriented to person, place, and time; appropriate mood & affect Lymphatic: No generalized UE edema or lymphadenopathy Extremities / MSK:  Both UE are normal with respect to appearance, ranges of motion, joint stability, muscle strength/tone, sensation, & perfusion except as otherwise noted:  Left ring finger mildly tender over the A1 pulley, with inducible catching.  With hyperextension of the left ring finger, there is pain throughout the length of the tendon into the wrist.  Small finger has mild tenderness at the A1 pulley  The left TMC is also quite painful with palpation which is intensified with grind testing.  Additionally there is tenderness directly over the pes of warm with palpation.  This is intensified with compression of the piece of form triquetral joint.  Labs / Xrays:  No radiographic studies obtained today.  Assessment: 1.  Left small finger trigger-injected 05/27/2019 & 07-08-19 2.  Left ring finger trigger finger-injected 07-08-19 & 01-13-20 & 02-09-20-still unresolved 3.  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  It was then  prepped with alcohol, topically anesthetized with ethyl chloride, and 1.0 mL of lidocaine buffered with bicarbonate was injected through a 27-gauge needle to provide deeper anesthesia.  Then a mixture of 1 mL of generic Celestone / 0.5 mL of lidocaine was  injected through a 22-gauge needle into the left TMC joint with fluoroscopic guidance.  A Band-Aid was applied.    Procedure: The intended site was confirmed by palpation and marked with a pen and found with fluoroscopic guidance.  The site was then prepped with alcohol, topically anesthetized with ethyl chloride, and a mixture of 1 mL generic Celestone/ 0.5 mL lidocaine was  injected through a 22-gauge needle into the affected joint.  A Band-Aid was applied.  The left pisotriquetral  joint was injected.  Plan:  In addition to these relatively new problems, we discussed once again resolution to the ring finger trigger.  The patient wishes to move forward with left ring finger trigger release.  He was also provided a cool comfort splint.  The details of the operative procedure were discussed with the patient.  Questions were invited and answered.  In addition to the goal of the procedure, the risks of the procedure to include but not limited to bleeding; infection; damage to the nerves or blood vessels that could result in bleeding, numbness, weakness, chronic pain, and the need for additional procedures; stiffness; the need for revision surgery; and anesthetic risks were reviewed.  No specific outcome was guaranteed or implied.  Informed consent was obtained.   Work status: All interested parties should please consider this patient to be out of work entirely if no work is available that complies with the restrictions detailed below.  If these restrictions result in the patient being out of work entirely, the employer is expected to provide documentation of such to all interested third parties such as disability insurance companies:    As tolerated, without restrictions.  Electronically verified by:  Cliffton Asters Janee Morn, MD  CC: Renaldo Harrison, DO

## 2020-03-13 ENCOUNTER — Ambulatory Visit (HOSPITAL_BASED_OUTPATIENT_CLINIC_OR_DEPARTMENT_OTHER): Payer: Medicare Other | Admitting: Certified Registered"

## 2020-03-13 ENCOUNTER — Other Ambulatory Visit: Payer: Self-pay

## 2020-03-13 ENCOUNTER — Ambulatory Visit (HOSPITAL_BASED_OUTPATIENT_CLINIC_OR_DEPARTMENT_OTHER)
Admission: RE | Admit: 2020-03-13 | Discharge: 2020-03-13 | Disposition: A | Discharge status: Home / Self Care | Payer: Medicare Other | Attending: Orthopedic Surgery | Admitting: Orthopedic Surgery

## 2020-03-13 ENCOUNTER — Encounter (HOSPITAL_BASED_OUTPATIENT_CLINIC_OR_DEPARTMENT_OTHER): Payer: Self-pay | Admitting: Orthopedic Surgery

## 2020-03-13 ENCOUNTER — Encounter (HOSPITAL_BASED_OUTPATIENT_CLINIC_OR_DEPARTMENT_OTHER)
Admission: RE | Disposition: A | Discharge status: Home / Self Care | Payer: Self-pay | Source: Home / Self Care | Attending: Orthopedic Surgery

## 2020-03-13 DIAGNOSIS — M65342 Trigger finger, left ring finger: Secondary | ICD-10-CM | POA: Diagnosis not present

## 2020-03-13 DIAGNOSIS — F17200 Nicotine dependence, unspecified, uncomplicated: Secondary | ICD-10-CM | POA: Insufficient documentation

## 2020-03-13 HISTORY — PX: TRIGGER FINGER RELEASE: SHX641

## 2020-03-13 LAB — GLUCOSE, CAPILLARY
Glucose-Capillary: 115 mg/dL — ABNORMAL HIGH (ref 70–99)
Glucose-Capillary: 124 mg/dL — ABNORMAL HIGH (ref 70–99)

## 2020-03-13 SURGERY — RELEASE, A1 PULLEY, FOR TRIGGER FINGER
Anesthesia: Monitor Anesthesia Care | Site: Hand | Laterality: Left

## 2020-03-13 MED ORDER — OXYCODONE HCL 5 MG/5ML PO SOLN
5.0000 mg | Freq: Once | ORAL | Status: DC | PRN
Start: 2020-03-13 — End: 2020-03-13

## 2020-03-13 MED ORDER — LIDOCAINE HCL (PF) 1 % IJ SOLN
INTRAMUSCULAR | Status: AC
  Filled 2020-03-13: qty 30

## 2020-03-13 MED ORDER — ONDANSETRON HCL 4 MG/2ML IJ SOLN: 4.0000 mg | Freq: Once | INTRAMUSCULAR | Status: DC | PRN

## 2020-03-13 MED ORDER — LACTATED RINGERS IV SOLN: INTRAVENOUS | Status: DC

## 2020-03-13 MED ORDER — FENTANYL CITRATE (PF) 100 MCG/2ML IJ SOLN: 25.0000 ug | INTRAMUSCULAR | Status: DC | PRN

## 2020-03-13 MED ORDER — FENTANYL CITRATE (PF) 100 MCG/2ML IJ SOLN
INTRAMUSCULAR | Status: AC
  Filled 2020-03-13: qty 2

## 2020-03-13 MED ORDER — ACETAMINOPHEN 325 MG PO TABS: 650.0000 mg | ORAL_TABLET | Freq: Four times a day (QID) | ORAL | Status: AC

## 2020-03-13 MED ORDER — PHENYLEPHRINE 40 MCG/ML (10ML) SYRINGE FOR IV PUSH (FOR BLOOD PRESSURE SUPPORT)
PREFILLED_SYRINGE | INTRAVENOUS | Status: AC
  Filled 2020-03-13: qty 10

## 2020-03-13 MED ORDER — CEFAZOLIN SODIUM-DEXTROSE 2-4 GM/100ML-% IV SOLN
2.0000 g | INTRAVENOUS | Status: AC
  Administered 2020-03-13: 12:00:00 2 g via INTRAVENOUS

## 2020-03-13 MED ORDER — PROPOFOL 500 MG/50ML IV EMUL
INTRAVENOUS | Status: DC | PRN
  Administered 2020-03-13: 100 ug/kg/min via INTRAVENOUS

## 2020-03-13 MED ORDER — OXYCODONE HCL 5 MG PO TABS: 5.0000 mg | ORAL_TABLET | Freq: Once | ORAL | Status: DC | PRN

## 2020-03-13 MED ORDER — FENTANYL CITRATE (PF) 250 MCG/5ML IJ SOLN
INTRAMUSCULAR | Status: DC | PRN
  Administered 2020-03-13: 25 ug via INTRAVENOUS
  Administered 2020-03-13: 75 ug via INTRAVENOUS

## 2020-03-13 MED ORDER — ONDANSETRON HCL 4 MG/2ML IJ SOLN
INTRAMUSCULAR | Status: AC
  Filled 2020-03-13: qty 2

## 2020-03-13 MED ORDER — CEFAZOLIN SODIUM-DEXTROSE 2-4 GM/100ML-% IV SOLN
INTRAVENOUS | Status: AC
  Filled 2020-03-13: qty 100

## 2020-03-13 MED ORDER — BUPIVACAINE-EPINEPHRINE 0.5% -1:200000 IJ SOLN
INTRAMUSCULAR | Status: DC | PRN
  Administered 2020-03-13: 4 mL

## 2020-03-13 SURGICAL SUPPLY — 37 items
APL PRP STRL LF DISP 70% ISPRP (MISCELLANEOUS) ×1
BLADE SURG 15 STRL LF DISP TIS (BLADE) ×1 IMPLANT
BLADE SURG 15 STRL SS (BLADE) ×3
BNDG CMPR 9X4 STRL LF SNTH (GAUZE/BANDAGES/DRESSINGS) ×1
BNDG COHESIVE 1X5 TAN STRL LF (GAUZE/BANDAGES/DRESSINGS) ×3 IMPLANT
BNDG CONFORM 2 STRL LF (GAUZE/BANDAGES/DRESSINGS) ×3 IMPLANT
BNDG ESMARK 4X9 LF (GAUZE/BANDAGES/DRESSINGS) ×3 IMPLANT
CHLORAPREP W/TINT 26 (MISCELLANEOUS) ×3 IMPLANT
COVER BACK TABLE 60X90IN (DRAPES) ×3 IMPLANT
COVER MAYO STAND STRL (DRAPES) ×3 IMPLANT
COVER WAND RF STERILE (DRAPES) IMPLANT
CUFF TOURN SGL QUICK 18X4 (TOURNIQUET CUFF) ×3 IMPLANT
DRAPE EXTREMITY T 121X128X90 (DISPOSABLE) ×3 IMPLANT
DRAPE SURG 17X23 STRL (DRAPES) ×3 IMPLANT
DRSG EMULSION OIL 3X3 NADH (GAUZE/BANDAGES/DRESSINGS) ×3 IMPLANT
GAUZE SPONGE 4X4 12PLY STRL LF (GAUZE/BANDAGES/DRESSINGS) ×3 IMPLANT
GLOVE BIO SURGEON STRL SZ7.5 (GLOVE) ×3 IMPLANT
GLOVE BIOGEL PI IND STRL 7.0 (GLOVE) ×2 IMPLANT
GLOVE BIOGEL PI INDICATOR 7.0 (GLOVE) ×4
GLOVE ECLIPSE 6.5 STRL STRAW (GLOVE) ×6 IMPLANT
GLOVE SRG 8 PF TXTR STRL LF DI (GLOVE) ×1 IMPLANT
GLOVE SURG UNDER POLY LF SZ8 (GLOVE) ×3
GOWN STRL REUS W/ TWL LRG LVL3 (GOWN DISPOSABLE) ×2 IMPLANT
GOWN STRL REUS W/TWL LRG LVL3 (GOWN DISPOSABLE) ×6
GOWN STRL REUS W/TWL XL LVL3 (GOWN DISPOSABLE) ×3 IMPLANT
NDL SAFETY ECLIPSE 18X1.5 (NEEDLE) IMPLANT
NEEDLE HYPO 18GX1.5 SHARP (NEEDLE)
NEEDLE HYPO 25X1 1.5 SAFETY (NEEDLE) ×3 IMPLANT
NS IRRIG 1000ML POUR BTL (IV SOLUTION) ×3 IMPLANT
PACK BASIN DAY SURGERY FS (CUSTOM PROCEDURE TRAY) ×3 IMPLANT
STOCKINETTE 6  STRL (DRAPES) ×2
STOCKINETTE 6 STRL (DRAPES) ×1 IMPLANT
SUT VICRYL RAPIDE 4/0 PS 2 (SUTURE) ×3 IMPLANT
SYR 10ML LL (SYRINGE) ×3 IMPLANT
SYR BULB EAR ULCER 3OZ GRN STR (SYRINGE) ×3 IMPLANT
TOWEL GREEN STERILE FF (TOWEL DISPOSABLE) ×3 IMPLANT
UNDERPAD 30X36 HEAVY ABSORB (UNDERPADS AND DIAPERS) ×3 IMPLANT

## 2020-03-13 NOTE — Interval H&P Note (Signed)
History and Physical Interval Note:  03/13/2020 10:55 AM  Corey Jensen  has presented today for surgery, with the diagnosis of LEFT RING FINGER TRIGGER.  The various methods of treatment have been discussed with the patient and family. After consideration of risks, benefits and other options for treatment, the patient has consented to  Procedure(s) with comments: LEFT RING FINGER TRIGGER RELEASE (Left) - LENGTH OF SURGERY: as a surgical intervention.  The patient's history has been reviewed, patient examined, no change in status, stable for surgery.  I have reviewed the patient's chart and labs.  Questions were answered to the patient's satisfaction.     Jodi Marble

## 2020-03-13 NOTE — Discharge Instructions (Signed)
Discharge Instructions   You have a light dressing on your hand.  You may begin gentle motion of your fingers and hand immediately, but you should not do any heavy lifting or gripping.  Elevate your hand to reduce pain & swelling of the digits.  Ice over the operative site may be helpful to reduce pain & swelling.  DO NOT USE HEAT. Pain medicine has been prescribed for you.  Take Tylenol 650 mg every 6 hours in addition to your daily pain medicine.  Leave the dressing in place until the third day after your surgery and then remove it, leaving it open to air.  After the bandage has been removed you may shower, regularly washing the incision and letting the water run over it, but not submerging it (no swimming, soaking it in dishwater, etc.) You may drive a car when you are off of prescription pain medications and can safely control your vehicle with both hands. We will address whether therapy will be required or not when you return to the office. You may have already made your follow-up appointment when we completed your preop visit.  If not, please call our office today or the next business day to make your return appointment for 10-15 days after surgery.   Please call [phone] during normal business hours or [phone] after hours for any problems. Including the following:  - excessive redness of the incisions - drainage for more than 4 days - fever of more than 101.5 F  *Please note that pain medications will not be refilled after hours or on weekends.    Post Anesthesia Home Care Instructions  Activity: Get plenty of rest for the remainder of the day. A responsible individual must stay with you for 24 hours following the procedure.  For the next 24 hours, DO NOT: -Drive a car -Advertising copywriter -Drink alcoholic beverages -Take any medication unless instructed by your physician -Make any legal decisions or sign important papers.  Meals: Start with liquid foods such as  gelatin or soup. Progress to regular foods as tolerated. Avoid greasy, spicy, heavy foods. If nausea and/or vomiting occur, drink only clear liquids until the nausea and/or vomiting subsides. Call your physician if vomiting continues.  Special Instructions/Symptoms: Your throat may feel dry or sore from the anesthesia or the breathing tube placed in your throat during surgery. If this causes discomfort, gargle with warm salt water. The discomfort should disappear within 24 hours.  If you had a scopolamine patch placed behind your ear for the management of post- operative nausea and/or vomiting:  1. The medication in the patch is effective for 72 hours, after which it should be removed.  Wrap patch in a tissue and discard in the trash. Wash hands thoroughly with soap and water. 2. You may remove the patch earlier than 72 hours if you experience unpleasant side effects which may include dry mouth, dizziness or visual disturbances. 3. Avoid touching the patch. Wash your hands with soap and water after contact with the patch.    Call your surgeon if you experience:   1.  Fever over 101.0. 2.  Inability to urinate. 3.  Nausea and/or vomiting. 4.  Extreme swelling or bruising at the surgical site. 5.  Continued bleeding from the incision. 6.  Increased pain, redness or drainage from the incision. 7.  Problems related to your pain medication. 8.  Any problems and/or concerns

## 2020-03-13 NOTE — Anesthesia Postprocedure Evaluation (Signed)
Anesthesia Post Note  Patient: Corey Jensen  Procedure(s) Performed: LEFT RING FINGER TRIGGER RELEASE (Left Hand)     Patient location during evaluation: PACU Anesthesia Type: MAC Level of consciousness: awake and alert Pain management: pain level controlled Vital Signs Assessment: post-procedure vital signs reviewed and stable Respiratory status: spontaneous breathing, nonlabored ventilation and respiratory function stable Cardiovascular status: stable and blood pressure returned to baseline Postop Assessment: no apparent nausea or vomiting Anesthetic complications: no   No complications documented.  Last Vitals:  Vitals:   03/13/20 1241 03/13/20 1246  BP:    Pulse: 68 64  Resp: 14 15  Temp:    SpO2: 95% 100%    Last Pain:  Vitals:   03/13/20 1235  TempSrc:   PainSc: 0-No pain                 Arpan Eskelson A.

## 2020-03-13 NOTE — Op Note (Signed)
03/13/2020  10:55 AM  PATIENT:  Corey Jensen  64 y.o. male  PRE-OPERATIVE DIAGNOSIS:  LEFT RING FINGER TRIGGER  POST-OPERATIVE DIAGNOSIS:  Same  PROCEDURE:  Procedure(s): LEFT RING FINGER TRIGGER RELEASE  SURGEON:  Surgeon(s): Milly Jakob, MD  PHYSICIAN ASSISTANT: Morley Kos, OPA-C  ANESTHESIA:  Local / MAC  SPECIMENS:  None  DRAINS:   None  EBL:  less than 50 mL  PREOPERATIVE INDICATIONS:  [name] is a  64 y.o. male with a diagnosis of LEFT [place] who failed conservative measures and elected for surgical management.    The risks benefits and alternatives were discussed with the patient preoperatively including but not limited to the risks of infection, bleeding, nerve injury, cardiopulmonary complications, the need for revision surgery, among others, and the patient verbalized understanding and consented to proceed.  OPERATIVE IMPLANTS: None  OPERATIVE FINDINGS: See Below  OPERATIVE PROCEDURE:  After receiving prophylactic antibiotics, the patient was escorted to the operative theatre and placed in a supine position.   A surgical "time-out" was performed during which the planned procedure, proposed operative site, and the correct patient identity were compared to the operative consent and agreement confirmed by the circulating nurse according to current facility policy.  Digital block was performed with a mixture of lidocaine and Marcaine, bearing epinephrine. Following application of a tourniquet to the operative extremity, the exposed skin was prepped with Chloraprep and draped in the usual sterile fashion.  The limb was exsanguinated with an Esmarch bandage and the tourniquet inflated to approximately 192mHg higher than systolic BP.  An oblique incision was made over the A1 pulley of the affected digit.  Subcutaneous taste tissues were dissected with blunt and spreading dissection to reveal an underlying flexor tendon sheath and A1 pulley.   With the neurovascular structures protected, the A1 pulley was split in the midline under direct visualization with loupe assistance.  Some crossing bands proximal to the A1 pulley were also released.  The tendons were pulled into view, cleaned of thickened synovium and return to their bed.  The wound was irrigated, tourniquet released, and skin closed with 4-0 Vicryl Rapide.  A light dressing was applied and the patient was taken to the recovery room.  DISPOSITION: The patient will be discharged home today with typical instructions, returning in 10-15 days.

## 2020-03-13 NOTE — Transfer of Care (Signed)
Immediate Anesthesia Transfer of Care Note  Patient: Erasmus Bistline  Procedure(s) Performed: LEFT RING FINGER TRIGGER RELEASE (Left Hand)  Patient Location: PACU  Anesthesia Type:MAC  Level of Consciousness: awake, alert , oriented and patient cooperative  Airway & Oxygen Therapy: Patient Spontanous Breathing and Patient connected to face mask oxygen  Post-op Assessment: Report given to RN, Post -op Vital signs reviewed and stable and Patient moving all extremities  Post vital signs: Reviewed and stable  Last Vitals:  Vitals Value Taken Time  BP    Temp    Pulse 72 03/13/20 1235  Resp 17 03/13/20 1235  SpO2 97 % 03/13/20 1235  Vitals shown include unvalidated device data.  Last Pain:  Vitals:   03/13/20 1021  TempSrc: Oral  PainSc: 6       Patients Stated Pain Goal: 5 (93/11/21 6244)  Complications: No complications documented.

## 2020-03-13 NOTE — Anesthesia Preprocedure Evaluation (Addendum)
Anesthesia Evaluation  Patient identified by MRN, date of birth, ID band Patient awake    Reviewed: Allergy & Precautions, NPO status , Patient's Chart, lab work & pertinent test results, reviewed documented beta blocker date and time   Airway Mallampati: III  TM Distance: >3 FB Neck ROM: Full    Dental  (+) Missing,  Crown missing left upper lateral incisor:   Pulmonary Current Smoker,    Pulmonary exam normal breath sounds clear to auscultation       Cardiovascular hypertension, Pt. on medications and Pt. on home beta blockers Normal cardiovascular exam Rhythm:Regular Rate:Normal     Neuro/Psych Peripheral neuropathy negative psych ROS   GI/Hepatic negative GI ROS, Neg liver ROS,   Endo/Other  diabetes, Well Controlled, Type 2, Oral Hypoglycemic AgentsHyperlipidemia  Renal/GU negative Renal ROS  negative genitourinary   Musculoskeletal  (+) Arthritis , Osteoarthritis,  Trigger finger left ring finger   Abdominal (+) + obese,   Peds  Hematology negative hematology ROS (+)   Anesthesia Other Findings   Reproductive/Obstetrics                            Anesthesia Physical Anesthesia Plan  ASA: III  Anesthesia Plan: MAC   Post-op Pain Management:    Induction: Intravenous  PONV Risk Score and Plan: 2 and Ondansetron, Treatment may vary due to age or medical condition and Midazolam  Airway Management Planned: Natural Airway, Simple Face Mask and Nasal Cannula  Additional Equipment:   Intra-op Plan:   Post-operative Plan:   Informed Consent: I have reviewed the patients History and Physical, chart, labs and discussed the procedure including the risks, benefits and alternatives for the proposed anesthesia with the patient or authorized representative who has indicated his/her understanding and acceptance.     Dental advisory given  Plan Discussed with: CRNA and  Anesthesiologist  Anesthesia Plan Comments:         Anesthesia Quick Evaluation

## 2020-03-15 ENCOUNTER — Encounter (HOSPITAL_BASED_OUTPATIENT_CLINIC_OR_DEPARTMENT_OTHER): Payer: Self-pay | Admitting: Orthopedic Surgery

## 2021-11-14 IMAGING — DX DG FOOT COMPLETE 3+V*L*
3 series · 3 of 3 positions shown · non-contrast
Comparison: None.

CLINICAL DATA: Status post amputation of the distal left second toe
today.

EXAM:
LEFT FOOT - COMPLETE 3+ VIEW

[foot ap]
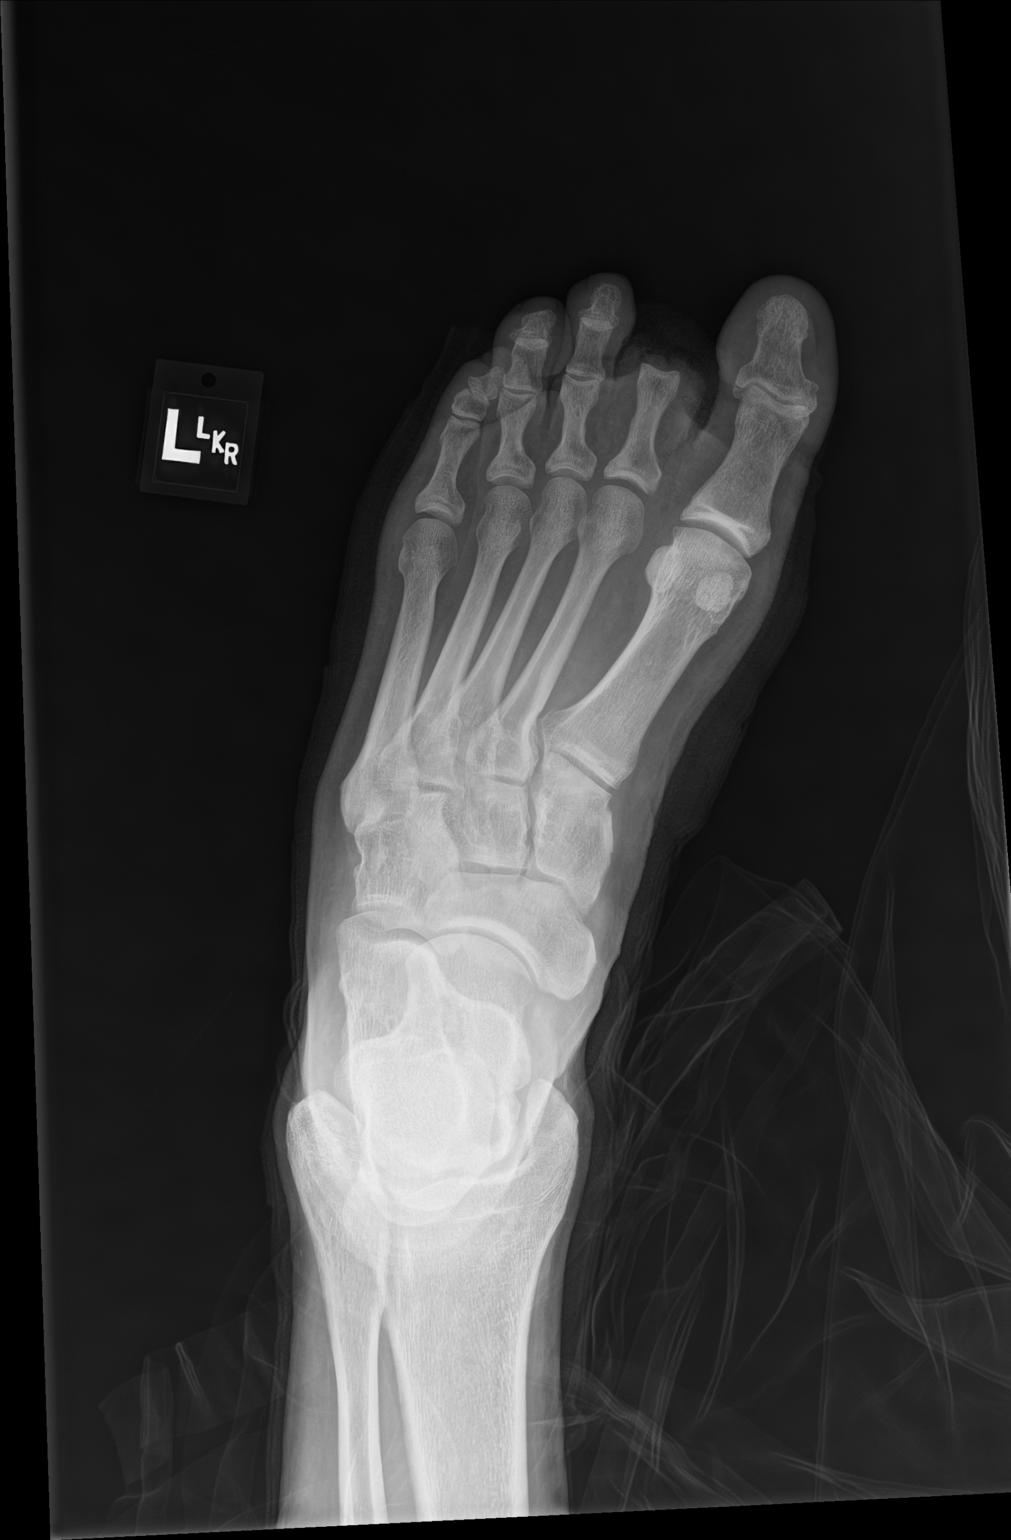

[foot obl]
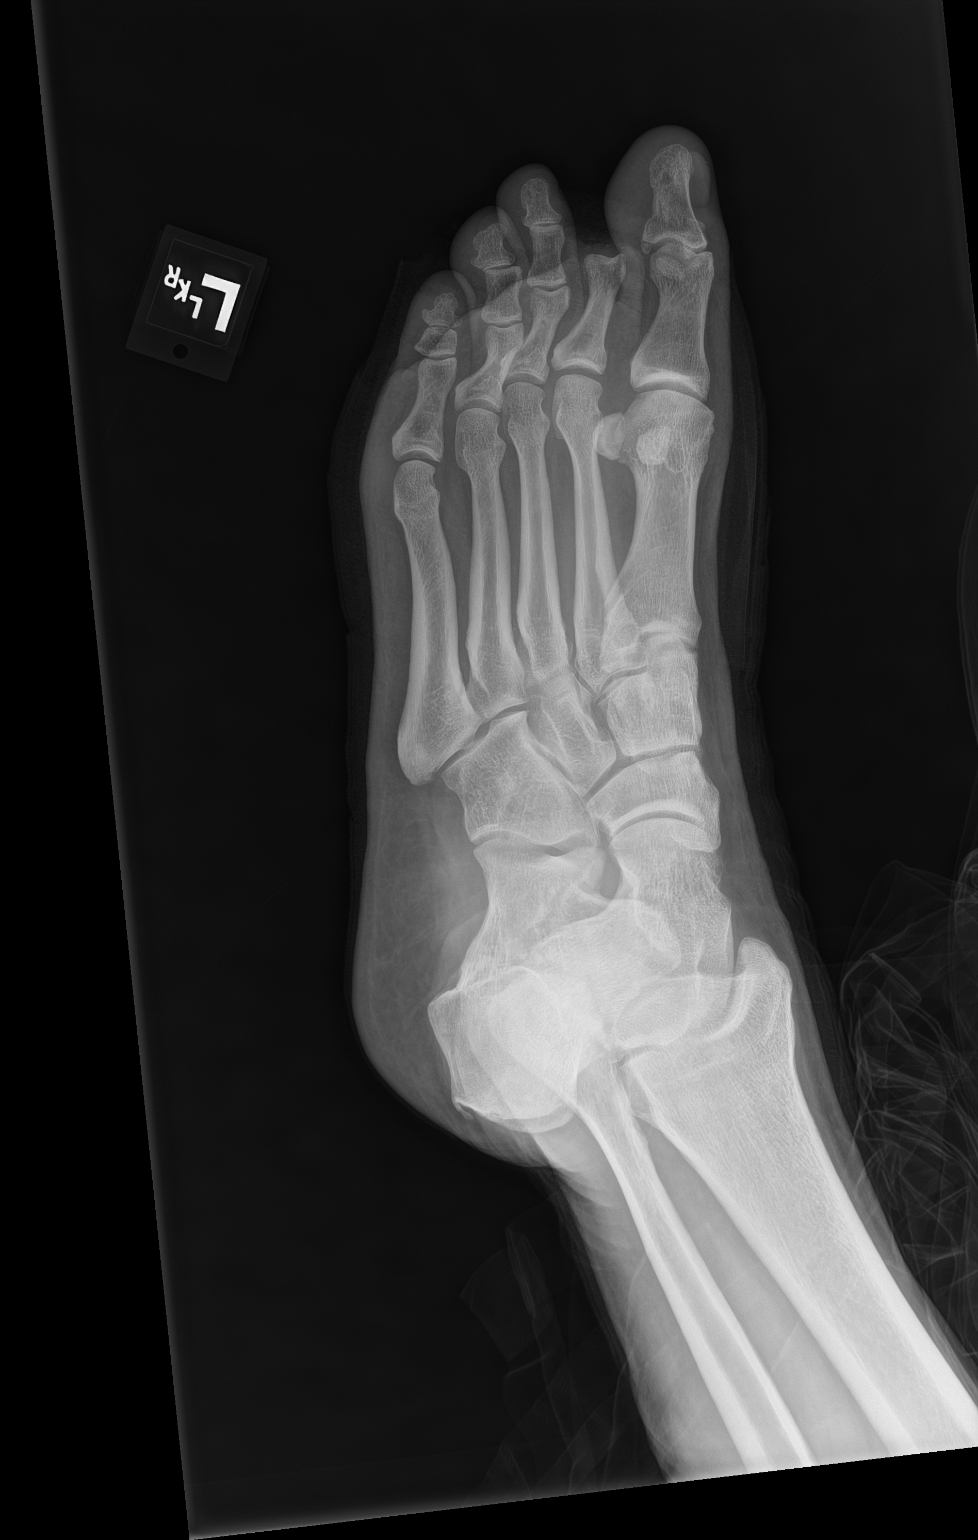

[foot lat]
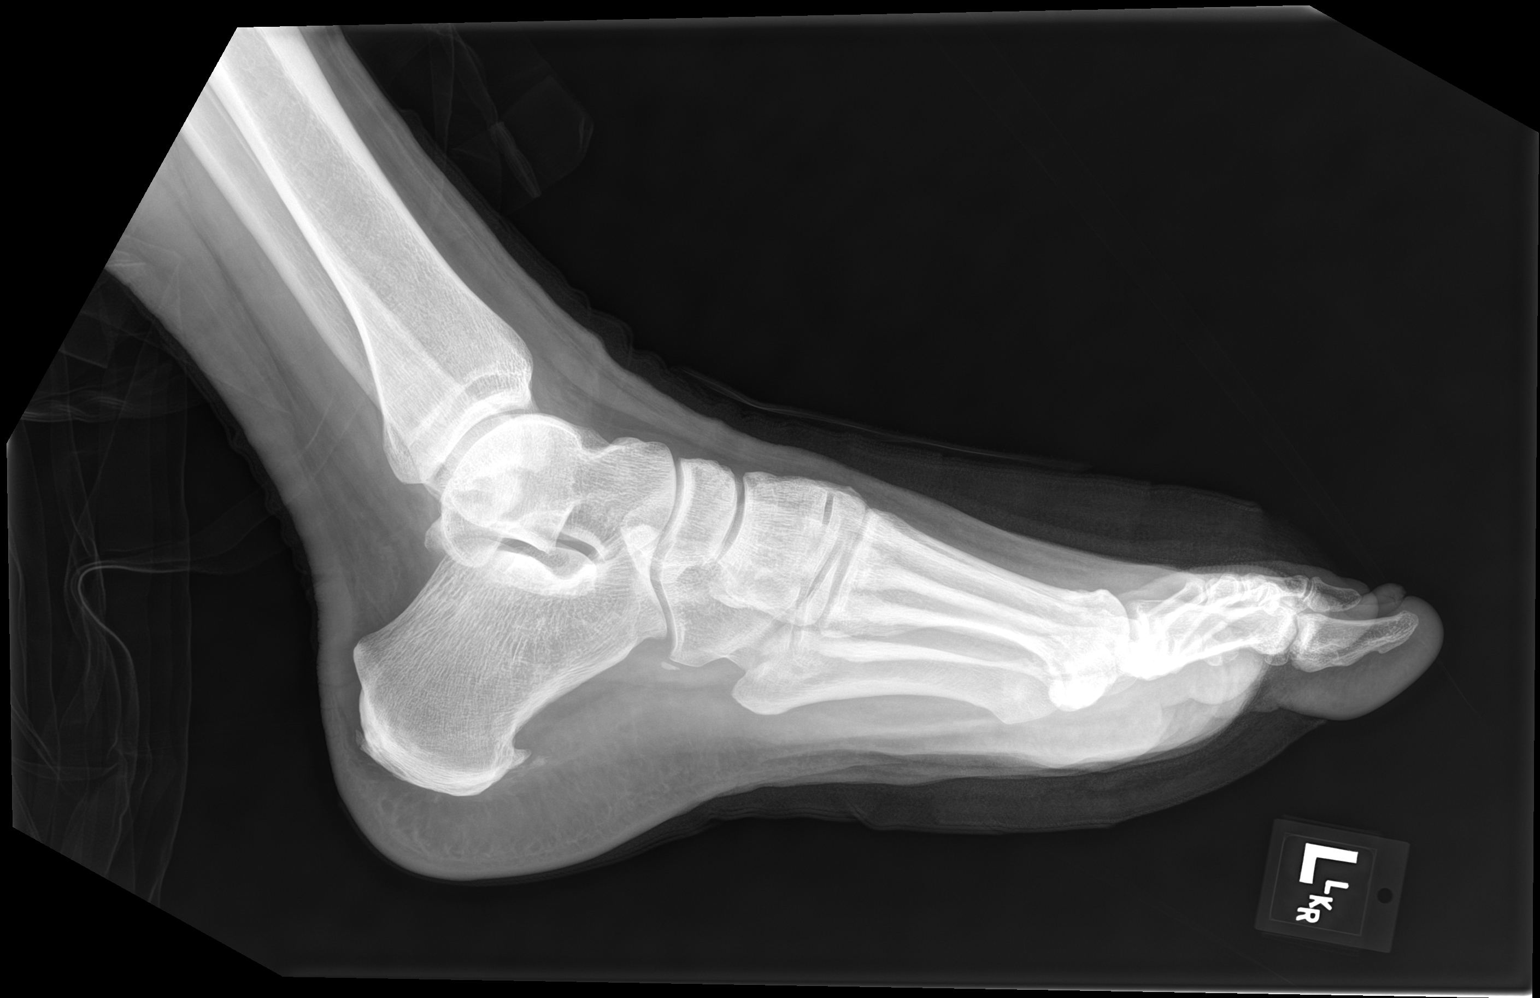

[3 of 3 positions shown; findings below may reference images not displayed]

FINDINGS: The patient is status post amputation of the second toe at the level
of the PIP joint. Soft tissue wound is noted. No acute bony or joint
abnormality.
IMPRESSION: Status post partial amputation of the second toe.  No acute finding.
